# Patient Record
Sex: Female | Born: 1985 | Hispanic: Yes | Marital: Single | Smoking: Never smoker
Health system: Southern US, Community
[De-identification: ages and names within clinical notes are randomized; demographics above are authoritative.]

## PROBLEM LIST (undated history)

## (undated) DIAGNOSIS — I10 Essential (primary) hypertension: Secondary | ICD-10-CM

---

## 2011-05-01 HISTORY — PX: TUBAL LIGATION: SHX77

## 2015-03-13 ENCOUNTER — Encounter (HOSPITAL_COMMUNITY): Payer: Self-pay

## 2015-03-13 ENCOUNTER — Emergency Department (HOSPITAL_COMMUNITY)
Admission: EM | Admit: 2015-03-13 | Discharge: 2015-03-13 | Disposition: A | Discharge status: Home / Self Care | Payer: Medicaid Other | Attending: Physician Assistant | Admitting: Physician Assistant

## 2015-03-13 DIAGNOSIS — R200 Anesthesia of skin: Secondary | ICD-10-CM | POA: Diagnosis present

## 2015-03-13 DIAGNOSIS — M5441 Lumbago with sciatica, right side: Secondary | ICD-10-CM | POA: Diagnosis not present

## 2015-03-13 DIAGNOSIS — R202 Paresthesia of skin: Secondary | ICD-10-CM

## 2015-03-13 DIAGNOSIS — I10 Essential (primary) hypertension: Secondary | ICD-10-CM | POA: Insufficient documentation

## 2015-03-13 HISTORY — DX: Essential (primary) hypertension: I10

## 2015-03-13 MED ORDER — METHOCARBAMOL 500 MG PO TABS: 500.0000 mg | ORAL_TABLET | Freq: Two times a day (BID) | ORAL | Status: DC | PRN

## 2015-03-13 MED ORDER — NAPROXEN 250 MG PO TABS: 250.0000 mg | ORAL_TABLET | Freq: Two times a day (BID) | ORAL | Status: DC

## 2015-03-13 NOTE — ED Notes (Signed)
Pt. Reports having rt. Arm numbness for a few days.  Denies any injuries into her neck or back.  Does have a hx of migraines but denies any headaches at the present time.  Also intermittent sore throat,    Pt. Has a hx of HTN and has stopped her Medication. Pt. Also has intermittent numbness rt. Leg.  Denies any injuries but does have rt. Lower back pain.

## 2015-03-13 NOTE — Discharge Instructions (Signed)
Back Exercises °The following exercises strengthen the muscles that help to support the back. They also help to keep the lower back flexible. Doing these exercises can help to prevent back pain or lessen existing pain. °If you have back pain or discomfort, try doing these exercises 2-3 times each day or as told by your health care provider. When the pain goes away, do them once each day, but increase the number of times that you repeat the steps for each exercise (do more repetitions). If you do not have back pain or discomfort, do these exercises once each day or as told by your health care provider. °EXERCISES °Single Knee to Chest °Repeat these steps 3-5 times for each leg: °· Lie on your back on a firm bed or the floor with your legs extended. °· Bring one knee to your chest. Your other leg should stay extended and in contact with the floor. °· Hold your knee in place by grabbing your knee or thigh. °· Pull on your knee until you feel a gentle stretch in your lower back. °· Hold the stretch for 10-30 seconds. °· Slowly release and straighten your leg. °Pelvic Tilt °Repeat these steps 5-10 times: °· Lie on your back on a firm bed or the floor with your legs extended. °· Bend your knees so they are pointing toward the ceiling and your feet are flat on the floor. °· Tighten your lower abdominal muscles to press your lower back against the floor. This motion will tilt your pelvis so your tailbone points up toward the ceiling instead of pointing to your feet or the floor. °· With gentle tension and even breathing, hold this position for 5-10 seconds. °Cat-Cow °Repeat these steps until your lower back becomes more flexible: °· Get into a hands-and-knees position on a firm surface. Keep your hands under your shoulders, and keep your knees under your hips. You may place padding under your knees for comfort. °· Let your head hang down, and point your tailbone toward the floor so your lower back becomes rounded like the  back of a cat. °· Hold this position for 5 seconds. °· Slowly lift your head and point your tailbone up toward the ceiling so your back forms a sagging arch like the back of a cow. °· Hold this position for 5 seconds. °Press-Ups °Repeat these steps 5-10 times: °· Lie on your abdomen (face-down) on the floor. °· Place your palms near your head, about shoulder-width apart. °· While you keep your back as relaxed as possible and keep your hips on the floor, slowly straighten your arms to raise the top half of your body and lift your shoulders. Do not use your back muscles to raise your upper torso. You may adjust the placement of your hands to make yourself more comfortable. °· Hold this position for 5 seconds while you keep your back relaxed. °· Slowly return to lying flat on the floor. °Bridges °Repeat these steps 10 times: °· Lie on your back on a firm surface. °· Bend your knees so they are pointing toward the ceiling and your feet are flat on the floor. °· Tighten your buttocks muscles and lift your buttocks off of the floor until your waist is at almost the same height as your knees. You should feel the muscles working in your buttocks and the back of your thighs. If you do not feel these muscles, slide your feet 1-2 inches farther away from your buttocks. °· Hold this position for 3-5   seconds. °· Slowly lower your hips to the starting position, and allow your buttocks muscles to relax completely. °If this exercise is too easy, try doing it with your arms crossed over your chest. °Abdominal Crunches °Repeat these steps 5-10 times: °· Lie on your back on a firm bed or the floor with your legs extended. °· Bend your knees so they are pointing toward the ceiling and your feet are flat on the floor. °· Cross your arms over your chest. °· Tip your chin slightly toward your chest without bending your neck. °· Tighten your abdominal muscles and slowly raise your trunk (torso) high enough to lift your shoulder blades a  tiny bit off of the floor. Avoid raising your torso higher than that, because it can put too much stress on your low back and it does not help to strengthen your abdominal muscles. °· Slowly return to your starting position. °Back Lifts °Repeat these steps 5-10 times: °· Lie on your abdomen (face-down) with your arms at your sides, and rest your forehead on the floor. °· Tighten the muscles in your legs and your buttocks. °· Slowly lift your chest off of the floor while you keep your hips pressed to the floor. Keep the back of your head in line with the curve in your back. Your eyes should be looking at the floor. °· Hold this position for 3-5 seconds. °· Slowly return to your starting position. °SEEK MEDICAL CARE IF: °· Your back pain or discomfort gets much worse when you do an exercise. °· Your back pain or discomfort does not lessen within 2 hours after you exercise. °If you have any of these problems, stop doing these exercises right away. Do not do them again unless your health care provider says that you can. °SEEK IMMEDIATE MEDICAL CARE IF: °· You develop sudden, severe back pain. If this happens, stop doing the exercises right away. Do not do them again unless your health care provider says that you can. °  °This information is not intended to replace advice given to you by your health care provider. Make sure you discuss any questions you have with your health care provider. °  °Document Released: 05/24/2004 Document Revised: 01/05/2015 Document Reviewed: 06/10/2014 °Elsevier Interactive Patient Education ©2016 [employer] °Sciatica °Sciatica is pain, weakness, numbness, or tingling along the path of the sciatic nerve. The nerve starts in the lower back and runs down the back of each leg. The nerve controls the muscles in the lower leg and in the back of the knee, while also providing sensation to the back of the thigh, lower leg, and the sole of your foot. Sciatica is a symptom of another medical  condition. For instance, nerve damage or certain conditions, such as a herniated disk or bone spur on the spine, pinch or put pressure on the sciatic nerve. This causes the pain, weakness, or other sensations normally associated with sciatica. Generally, sciatica only affects one side of the body. °CAUSES  °· Herniated or slipped disc. °· Degenerative disk disease. °· A pain disorder involving the narrow muscle in the buttocks (piriformis syndrome). °· Pelvic injury or fracture. °· Pregnancy. °· Tumor (rare). °SYMPTOMS  °Symptoms can vary from mild to very severe. The symptoms usually travel from the low back to the buttocks and down the back of the leg. Symptoms can include: °· Mild tingling or dull aches in the lower back, leg, or hip. °· Numbness in the back of the calf or sole of the   foot.  Burning sensations in the lower back, leg, or hip.  Sharp pains in the lower back, leg, or hip.  Leg weakness.  Severe back pain inhibiting movement. These symptoms may get worse with coughing, sneezing, laughing, or prolonged sitting or standing. Also, being overweight may worsen symptoms. DIAGNOSIS  Your caregiver will perform a physical exam to look for common symptoms of sciatica. He or she may ask you to do certain movements or activities that would trigger sciatic nerve pain. Other tests may be performed to find the cause of the sciatica. These may include:  Blood tests.  X-rays.  Imaging tests, such as an MRI or CT scan. TREATMENT  Treatment is directed at the cause of the sciatic pain. Sometimes, treatment is not necessary and the pain and discomfort goes away on its own. If treatment is needed, your caregiver may suggest:  Over-the-counter medicines to relieve pain.  Prescription medicines, such as anti-inflammatory medicine, muscle relaxants, or narcotics.  Applying heat or ice to the painful area.  Steroid injections to lessen pain, irritation, and inflammation around the  nerve.  Reducing activity during periods of pain.  Exercising and stretching to strengthen your abdomen and improve flexibility of your spine. Your caregiver may suggest losing weight if the extra weight makes the back pain worse.  Physical therapy.  Surgery to eliminate what is pressing or pinching the nerve, such as a bone spur or part of a herniated disk. HOME CARE INSTRUCTIONS   Only take over-the-counter or prescription medicines for pain or discomfort as directed by your caregiver.  Apply ice to the affected area for 20 minutes, 3-4 times a day for the first 48-72 hours. Then try heat in the same way.  Exercise, stretch, or perform your usual activities if these do not aggravate your pain.  Attend physical therapy sessions as directed by your caregiver.  Keep all follow-up appointments as directed by your caregiver.  Do not wear high heels or shoes that do not provide proper support.  Check your mattress to see if it is too soft. A firm mattress may lessen your pain and discomfort. SEEK IMMEDIATE MEDICAL CARE IF:   You lose control of your bowel or bladder (incontinence).  You have increasing weakness in the lower back, pelvis, buttocks, or legs.  You have redness or swelling of your back.  You have a burning sensation when you urinate.  You have pain that gets worse when you lie down or awakens you at night.  Your pain is worse than you have experienced in the past.  Your pain is lasting longer than 4 weeks.  You are suddenly losing weight without reason. MAKE SURE YOU:  Understand these instructions.  Will watch your condition.  Will get help right away if you are not doing well or get worse.   This information is not intended to replace advice given to you by your health care provider. Make sure you discuss any questions you have with your health care provider.   Document Released: 04/10/2001 Document Revised: 01/05/2015 Document Reviewed:  08/26/2011 Elsevier Interactive Patient Education 2016 [employer] Shoulder Pain The shoulder is the joint that connects your arms to your body. The bones that form the shoulder joint include the upper arm bone (humerus), the shoulder blade (scapula), and the collarbone (clavicle). The top of the humerus is shaped like a ball and fits into a rather flat socket on the scapula (glenoid cavity). A combination of muscles and strong, fibrous tissues that connect  muscles to bones (tendons) support your shoulder joint and hold the ball in the socket. Small, fluid-filled sacs (bursae) are located in different areas of the joint. They act as cushions between the bones and the overlying soft tissues and help reduce friction between the gliding tendons and the bone as you move your arm. Your shoulder joint allows a wide range of motion in your arm. This range of motion allows you to do things like scratch your back or throw a ball. However, this range of motion also makes your shoulder more prone to pain from overuse and injury. Causes of shoulder pain can originate from both injury and overuse and usually can be grouped in the following four categories:  Redness, swelling, and pain (inflammation) of the tendon (tendinitis) or the bursae (bursitis).  Instability, such as a dislocation of the joint.  Inflammation of the joint (arthritis).  Broken bone (fracture). HOME CARE INSTRUCTIONS   Apply ice to the sore area.  Put ice in a plastic bag.  Place a towel between your skin and the bag.  Leave the ice on for 15-20 minutes, 3-4 times per day for the first 2 days, or as directed by your health care provider.  Stop using cold packs if they do not help with the pain.  If you have a shoulder sling or immobilizer, wear it as long as your caregiver instructs. Only remove it to shower or bathe. Move your arm as little as possible, but keep your hand moving to prevent swelling.  Squeeze a soft ball or foam  pad as much as possible to help prevent swelling.  Only take over-the-counter or prescription medicines for pain, discomfort, or fever as directed by your caregiver. SEEK MEDICAL CARE IF:   Your shoulder pain increases, or new pain develops in your arm, hand, or fingers.  Your hand or fingers become cold and numb.  Your pain is not relieved with medicines. SEEK IMMEDIATE MEDICAL CARE IF:   Your arm, hand, or fingers are numb or tingling.  Your arm, hand, or fingers are significantly swollen or turn white or blue. MAKE SURE YOU:   Understand these instructions.  Will watch your condition.  Will get help right away if you are not doing well or get worse.   This information is not intended to replace advice given to you by your health care provider. Make sure you discuss any questions you have with your health care provider.   Document Released: 01/24/2005 Document Revised: 05/07/2014 Document Reviewed: 08/09/2014 Elsevier Interactive Patient Education [employer]

## 2015-03-13 NOTE — ED Provider Notes (Signed)
CSN: [id number]     Arrival date & time 03/13/15  4098 History   First MD Initiated Contact with Patient 03/13/15 1024     Chief Complaint  Patient presents with  . Numbness   Bailey Kelly is a 29 y.o. female with a history of hypertension and obesity who presents to the emergency department complaining of intermittent numbness and tingling to her right arm that sometimes moves to her left arm. She reports yesterday she started feeling that her right arm is numb and tingly and has persisted since yesterday. She reports she can still feel her her arm when touched but that it just "doesn't feel right." She reports sometimes she has this feeling in her left arm intermittently. She denies this feeling currently. She also complains of right low back pain that radiates down her posterior right leg. She reports all of her symptoms have been ongoing for 2-3 months. She denies injury or trauma to her head, neck or back. She denies neck pain. She reports she takes Aleve intermittently with some relief. The patient denies fevers, chills, neck pain, neck stiffness, headache, changes to her vision, trauma, double vision, abdominal pain, nausea, vomiting, diarrhea, loss of bladder control, loss of bowel control, difficulty urinating, urinary symptoms, lightheadedness, dizziness, weakness, history of cancer, history of IV drug use, or new rashes.  (Consider location/radiation/quality/duration/timing/severity/associated sxs/prior Treatment) HPI  Past Medical History  Diagnosis Date  . Hypertension    History reviewed. No pertinent past surgical history. No family history on file. Social History  Substance Use Topics  . Smoking status: Never Smoker   . Smokeless tobacco: None  . Alcohol Use: Yes     Comment: occassional   OB History    No data available     Review of Systems  Constitutional: Negative for fever and chills.  HENT: Negative for congestion and sore throat.   Eyes: Negative for pain and  visual disturbance.  Respiratory: Negative for cough and shortness of breath.   Cardiovascular: Negative for chest pain and palpitations.  Gastrointestinal: Negative for nausea, vomiting, abdominal pain and diarrhea.  Genitourinary: Negative for dysuria, urgency, frequency, hematuria, flank pain, decreased urine volume and difficulty urinating.  Musculoskeletal: Positive for back pain. Negative for neck pain.  Skin: Negative for rash.  Neurological: Positive for numbness. Negative for dizziness, syncope, speech difficulty, weakness, light-headedness and headaches.      Allergies  Review of patient's allergies indicates no known allergies.  Home Medications   Prior to Admission medications   Medication Sig Start Date End Date Taking? Authorizing Provider  methocarbamol (ROBAXIN) 500 MG tablet Take 1 tablet (500 mg total) by mouth 2 (two) times daily as needed for muscle spasms. 03/13/15   Everlene Farrier, PA-C  naproxen (NAPROSYN) 250 MG tablet Take 1 tablet (250 mg total) by mouth 2 (two) times daily with a meal. 03/13/15   Everlene Farrier, PA-C   BP 121/54 mmHg  Pulse 60  Temp(Src) 98.5 F (36.9 C) (Oral)  Resp 21  Ht  (1.6 m)  Wt 300 lb (136.079 kg)  BMI 53.16 kg/m2  SpO2 100%  LMP 02/10/2015 Physical Exam  Constitutional: She is oriented to person, place, and time. She appears well-developed and well-nourished. No distress.  Nontoxic appearing obese female.  HENT:  Head: Normocephalic and atraumatic.  Right Ear: External ear normal.  Left Ear: External ear normal.  Mouth/Throat: Oropharynx is clear and moist. No oropharyngeal exudate.  Bilateral tympanic membranes are pearly-gray without erythema or loss of  landmarks.   Eyes: Conjunctivae and EOM are normal. Pupils are equal, round, and reactive to light. Right eye exhibits no discharge. Left eye exhibits no discharge.  EOMs intact. No nystagmus. Vision is grossly intact.  Neck: Normal range of motion. Neck supple. No  JVD present. No tracheal deviation present.  No midline neck tenderness. Full range of motion of her neck without difficulty.  Cardiovascular: Normal rate, regular rhythm, normal heart sounds and intact distal pulses.  Exam reveals no gallop and no friction rub.   No murmur heard. Bilateral radial and posterior tibialis pulses are intact.  Pulmonary/Chest: Effort normal and breath sounds normal. No respiratory distress. She has no wheezes. She has no rales.  Abdominal: Soft. She exhibits no distension. There is no tenderness. There is no guarding.  Musculoskeletal: Normal range of motion. She exhibits no edema.  Patient has 5 out of 5 strength in her bilateral upper and lower extremities. She is able to ambulate with normal gait. She does have mild tenderness to her right low back. No midline neck or back tenderness. No back edema, deformity, ecchymosis, erythema or warmth. No bony point tenderness to her right arm. No deformity to her right arm. Good range of motion of her right arm at her shoulder and elbow.  Lymphadenopathy:    She has no cervical adenopathy.  Neurological: She is alert and oriented to person, place, and time. No cranial nerve deficit. Coordination normal.  The patient is alert and oriented 3. Cranial nerves are intact. No pronator drift. Finger to nose intact bilaterally. Vision is grossly intact. EOMs are intact. Sensation is intact to her bilateral upper and lower extremities. When asked she tells me she feels me touching her exactly the same on her right and left arm. Good and equal grip strength bilaterally. Speech is clear and coherent. She is able to ambulate with normal gait.  Skin: Skin is warm and dry. No rash noted. She is not diaphoretic. No erythema. No pallor.  Psychiatric: She has a normal mood and affect. Her behavior is normal.  Nursing note and vitals reviewed.   ED Course  Procedures (including critical care time) Labs Review Labs Reviewed - No data to  display  Imaging Review No results found.    EKG Interpretation None      Filed Vitals:   03/13/15 1030 03/13/15 1045 03/13/15 1100 03/13/15 1115  BP: 103/51 111/70 119/57 121/54  Pulse: 62 58 62 60  Temp:      TempSrc:      Resp:      Height:      Weight:      SpO2: 99% 99% 99% 100%     MDM   Meds given in ED:  Medications - No data to display  Discharge Medication List as of 03/13/2015 11:26 AM    START taking these medications   Details  methocarbamol (ROBAXIN) 500 MG tablet Take 1 tablet (500 mg total) by mouth 2 (two) times daily as needed for muscle spasms., Starting 03/13/2015, Until Discontinued, Print    naproxen (NAPROSYN) 250 MG tablet Take 1 tablet (250 mg total) by mouth 2 (two) times daily with a meal., Starting 03/13/2015, Until Discontinued, Print        Final diagnoses:  Right-sided low back pain with right-sided sciatica  Numbness and tingling of right arm   This  is a 29 y.o. female with a history of hypertension and obesity who presents to the emergency department complaining of intermittent numbness and  tingling to her right arm that sometimes moves to her left arm. She reports yesterday she started feeling that her right arm is numb and tingly and has persisted since yesterday. She reports she can still feel her her arm when touched but that it just "doesn't feel right." She reports sometimes she has this feeling in her left arm intermittently. She denies this feeling currently. She also complains of right low back pain that radiates down her posterior right leg. She reports all of her symptoms have been ongoing for 2-3 months. She denies injury or trauma to her head, neck or back. She denies neck pain. On exam the patient is afebrile and nontoxic appearing. She has no focal neurological deficits. She tells me she can feels me touching her exactly the same to her bilateral arms. She is able to handle a normal gait. She has no back pain red flags. Will  discharge with close follow-up by primary care. Patient given prescription for naproxen and Robaxin for her sciatic and low back pain. I encouraged back exercises. I advised strict return precautions. I advised the patient to follow-up with their primary care provider this week. I advised the patient to return to the emergency department with new or worsening symptoms or new concerns. The patient verbalized understanding and agreement with plan.   This patient was discussed with Dr. Corlis Leak who agrees with assessment and plan.    Everlene Farrier, PA-C 03/13/15 1147  Courteney Randall An, MD 03/13/15 1533

## 2015-06-07 ENCOUNTER — Encounter: Payer: Self-pay | Admitting: Internal Medicine

## 2015-06-07 ENCOUNTER — Other Ambulatory Visit: Payer: Self-pay | Admitting: Family Medicine

## 2015-06-07 ENCOUNTER — Ambulatory Visit (INDEPENDENT_AMBULATORY_CARE_PROVIDER_SITE_OTHER): Payer: Medicaid Other | Admitting: Internal Medicine

## 2015-06-07 VITALS — BP 147/94 | HR 79 | Temp 98.4°F | Ht 63.0 in | Wt 294.0 lb

## 2015-06-07 DIAGNOSIS — G43109 Migraine with aura, not intractable, without status migrainosus: Secondary | ICD-10-CM

## 2015-06-07 DIAGNOSIS — G5601 Carpal tunnel syndrome, right upper limb: Secondary | ICD-10-CM | POA: Diagnosis not present

## 2015-06-07 DIAGNOSIS — R030 Elevated blood-pressure reading, without diagnosis of hypertension: Secondary | ICD-10-CM | POA: Diagnosis not present

## 2015-06-07 DIAGNOSIS — R739 Hyperglycemia, unspecified: Secondary | ICD-10-CM | POA: Diagnosis not present

## 2015-06-07 DIAGNOSIS — IMO0001 Reserved for inherently not codable concepts without codable children: Secondary | ICD-10-CM

## 2015-06-07 DIAGNOSIS — I10 Essential (primary) hypertension: Secondary | ICD-10-CM | POA: Insufficient documentation

## 2015-06-07 DIAGNOSIS — G43909 Migraine, unspecified, not intractable, without status migrainosus: Secondary | ICD-10-CM | POA: Insufficient documentation

## 2015-06-07 DIAGNOSIS — G5600 Carpal tunnel syndrome, unspecified upper limb: Secondary | ICD-10-CM | POA: Insufficient documentation

## 2015-06-07 LAB — BASIC METABOLIC PANEL WITH GFR
BUN: 6 mg/dL — AB (ref 7–25)
CHLORIDE: 104 mmol/L (ref 98–110)
CO2: 25 mmol/L (ref 20–31)
Calcium: 9.3 mg/dL (ref 8.6–10.2)
Creat: 0.44 mg/dL — ABNORMAL LOW (ref 0.50–1.10)
GFR, Est African American: >89 mL/min (ref >=60)
GFR, Est Non African American: >89 mL/min (ref >=60)
GLUCOSE: 120 mg/dL — AB (ref 65–99)
POTASSIUM: 4.5 mmol/L (ref 3.5–5.3)
Sodium: 139 mmol/L (ref 135–146)

## 2015-06-07 LAB — TSH: TSH: 1.38 mIU/L

## 2015-06-07 NOTE — Assessment & Plan Note (Signed)
History and +Phalen's consistent with carpal tunnel on the right.  -cock up splint Rx given, trial of 6 weeks for improvement in symptoms  -TSH checked today given carpal tunnel as well as reported fatigue and weight gain

## 2015-06-07 NOTE — Assessment & Plan Note (Addendum)
BP mildly elevated today and patient reports history of HTN.  -obtain BMET today to evaluate for end organ damage  -will check BP at next visit and start HTN medication regimen if needed  -check A1C if glucose elevated given history of HTN as well as obesity concerning for possibility of T2DM

## 2015-06-07 NOTE — Progress Notes (Signed)
Subjective:    Bailey Kelly - 30 y.o. female MRN [id number]  Date of birth: 14-Dec-1985  HPI  Bailey Kelly is 31 y.o. female with PMH of reported HTN. migraines and obesity who presents to establish care with the clinic.   Headaches: Reports headaches 1x/week on average. Describes pain as over one side of her forehead. Pain usually lasts 3-4 hours at a time. Reports associated nausea but no vomiting. Blurry vision peripherally and hearing changes. Mild photophobia. Is still able to get through day with headache. Takes ibuprofen for the headaches with good relief.   HTN: Reports history of elevated BP since childhood requiring medications. Also had issues with HTN during pregnancy. Does not remember the name of the medication she was on and has been without medication for about 4 years.   Forearm and Hand Numbness: Reports hands and forearms getting numb with tingling sensation bilaterally R much worse than L. Lying down makes it worse and has to shake her hands out in the morning. Does housecleaning with repetitive movements. Occasionally has some swelling of the R wrist/hand compared to L.   ROS: +Fatigue, +Weight gain        -  reports that she has never smoked. She does not have any smokeless tobacco history on file. - Review of Systems: Per HPI. - Past Medical History: Patient Active Problem List   Diagnosis Date Noted  . Migraine 06/07/2015  . Elevated blood pressure 06/07/2015  . Carpal tunnel syndrome 06/07/2015  . Severe obesity (BMI >= 40) (HCC) 06/07/2015   - Medications: reviewed and updated Current Outpatient Prescriptions  Medication Sig Dispense Refill  . methocarbamol (ROBAXIN) 500 MG tablet Take 1 tablet (500 mg total) by mouth 2 (two) times daily as needed for muscle spasms. 20 tablet 0  . naproxen (NAPROSYN) 250 MG tablet Take 1 tablet (250 mg total) by mouth 2 (two) times daily with a meal. 30 tablet 0   No current facility-administered medications for this  visit.      Objective:   Physical Exam BP 147/94 mmHg  Pulse 79  Temp(Src) 98.4 F (36.9 C) (Oral)  Ht  (1.6 m)  Wt 294 lb (133.358 kg)  BMI 52.09 kg/m2  LMP 05/07/2015 (Approximate) Gen: NAD, alert, cooperative with exam, well-appearing HEENT: NCAT, PERRL, clear conjunctiva, oropharynx clear, supple neck CV: RRR, good S1/S2, no murmur Resp: CTABL, no wheezes, non-labored Abd: SNTND, BS present, no guarding or organomegaly Ext: 2+ radial pulses bilaterally, no edema or erythema to the wrists/hands bilaterally, no joint tenderness to palpation, negative Tinels, +Phalens on the R  Neuro: no gross deficits.  Psych: good insight, alert and oriented     Assessment & Plan:   Migraine Headache seems consistent with migraine. Well controlled with Ibuprofen. Patient is not at risk for medication overuse headache as she is only using Ibuprofen 4-5x per month.  -continue with ibuprofen for headaches as needed -if headaches became more debilitating or increased in frequency causing patient to use more OTC pain meds, would consider prophylaxis with medication such as propanolol   Elevated blood pressure BP mildly elevated today and patient reports history of HTN.  -obtain BMET today to evaluate for end organ damage  -will check BP at next visit and start HTN medication regimen if needed  -check A1C if glucose elevated given history of HTN as well as obesity concerning for possibility of T2DM   Severe obesity (BMI >= 40) (HCC) Patient reports increase in weight gain over the  past few years. Unfortunately history of previous weights not available in EMR. Patient does report that she knows she needs to lose this weight and has started to go to the gym.  -continue to encourage healthy lifestyle choices  -discuss nutrition consult at next visit   Carpal tunnel syndrome History and +Phalen's consistent with carpal tunnel on the right.  -cock up splint Rx given, trial of 6 weeks for  improvement in symptoms  -TSH checked today given carpal tunnel as well as reported fatigue and weight gain     Instructed patient to return in the next 1-3 weeks for PAP smear and further management of elevated blood pressure.   Marcy Siren, D.O. 06/07/2015, 10:00 AM PGY-1, [facility]

## 2015-06-07 NOTE — Patient Instructions (Signed)
Thank you for coming to see me today. It was a pleasure and nice to meet you. Today we talked about:   Arm Numbness: This sounds consistent with Carpal Tunnel. Please take your prescription for the splint to a medical supply company to have it filled.   We have gotten labs to check your basic electrolytes, kidney function, blood sugar, and thyroid level. I will call you with results.   Please follow-up with Dr. Earlene Plater in 1-3 weeks for a pap smear.   If you have any questions or concerns, please do not hesitate to call the office at [phone].  Take Care,   Marcy Siren, DO

## 2015-06-07 NOTE — Assessment & Plan Note (Signed)
Patient reports increase in weight gain over the past few years. Unfortunately history of previous weights not available in EMR. Patient does report that she knows she needs to lose this weight and has started to go to the gym.  -continue to encourage healthy lifestyle choices  -discuss nutrition consult at next visit

## 2015-06-07 NOTE — Assessment & Plan Note (Signed)
Headache seems consistent with migraine. Well controlled with Ibuprofen. Patient is not at risk for medication overuse headache as she is only using Ibuprofen 4-5x per month.  -continue with ibuprofen for headaches as needed -if headaches became more debilitating or increased in frequency causing patient to use more OTC pain meds, would consider prophylaxis with medication such as propanolol

## 2015-06-08 ENCOUNTER — Ambulatory Visit (INDEPENDENT_AMBULATORY_CARE_PROVIDER_SITE_OTHER): Payer: Medicaid Other | Admitting: Internal Medicine

## 2015-06-08 ENCOUNTER — Other Ambulatory Visit (HOSPITAL_COMMUNITY)
Admission: RE | Admit: 2015-06-08 | Discharge: 2015-06-08 | Disposition: A | Discharge status: Home / Self Care | Payer: Medicaid Other | Source: Ambulatory Visit | Attending: Family Medicine | Admitting: Family Medicine

## 2015-06-08 ENCOUNTER — Telehealth: Payer: Self-pay | Admitting: Internal Medicine

## 2015-06-08 ENCOUNTER — Encounter: Payer: Self-pay | Admitting: Internal Medicine

## 2015-06-08 VITALS — BP 135/74 | HR 83 | Temp 98.5°F | Wt 293.3 lb

## 2015-06-08 DIAGNOSIS — Z124 Encounter for screening for malignant neoplasm of cervix: Secondary | ICD-10-CM

## 2015-06-08 DIAGNOSIS — Z113 Encounter for screening for infections with a predominantly sexual mode of transmission: Secondary | ICD-10-CM

## 2015-06-08 DIAGNOSIS — Z01419 Encounter for gynecological examination (general) (routine) without abnormal findings: Secondary | ICD-10-CM | POA: Insufficient documentation

## 2015-06-08 DIAGNOSIS — I10 Essential (primary) hypertension: Secondary | ICD-10-CM | POA: Diagnosis not present

## 2015-06-08 DIAGNOSIS — Z0000 Encounter for general adult medical examination without abnormal findings: Secondary | ICD-10-CM

## 2015-06-08 LAB — POCT GLYCOSYLATED HEMOGLOBIN (HGB A1C): Hemoglobin A1C: 5.9

## 2015-06-08 LAB — RPR

## 2015-06-08 LAB — HIV ANTIBODY (ROUTINE TESTING W REFLEX): HIV: NONREACTIVE

## 2015-06-08 MED ORDER — HYDROCHLOROTHIAZIDE 12.5 MG PO TABS: 12.5000 mg | ORAL_TABLET | Freq: Every day | ORAL | Status: DC

## 2015-06-08 NOTE — Assessment & Plan Note (Signed)
Patient asymptomatic and not engaging in risky sexual behavior. However, she desired STD screening today.  -GC/Chlamydia obtained with PAP smear  -HIV and RPR ordered

## 2015-06-08 NOTE — Assessment & Plan Note (Signed)
Patient reports last PAP in 2011 with no h/o abnormal PAP smears.  -PAP performed today, will f/u with results to determine appropriate time of next PAP  -encouraged patient to perform self breast exams monthly

## 2015-06-08 NOTE — Addendum Note (Signed)
Addended by: Jennette Bill on: 06/08/2015 10:12 AM   Modules accepted: Orders

## 2015-06-08 NOTE — Patient Instructions (Signed)
Thank you for coming to see me today. It was a pleasure. Today we talked about:   Blood Pressure: Take the medication daily. We will be monitoring your blood pressure to see if we need to increase this dose over time.   Blood Sugar: I will let you know what your A1C (blood sugar over time) is and what types of things we might need to do based on the result.   Annual Female Exam: I will let you know the results of your PAP smear and other testing.   Please follow-up with Dr. Earlene Plater as needed.   If you have any questions or concerns, please do not hesitate to call the office at [phone].  Take Care,   Marcy Siren, DO

## 2015-06-08 NOTE — Assessment & Plan Note (Signed)
BP not as elevated today as at prior visit. Kidney function good on BMET yesterday. Patient desires to start BP medication as she has taken it in the past and she is worried some of her headaches might be related to elevated BPs.  -HCTZ 12.5 mg started today  -will titrate dose over time prn

## 2015-06-08 NOTE — Addendum Note (Signed)
Addended by: Jone Baseman D on: 06/08/2015 11:37 AM   Modules accepted: Kipp Brood

## 2015-06-08 NOTE — Progress Notes (Signed)
   Subjective:   Bailey Kelly is a 30 y.o. female with a history of HTN, obesity, and migraines here for an annual gynecological exam.   Gyn concerns/Preventative healthcare  Last menstrual period: Patient's last menstrual period was 05/07/2015 (approximate).  Regular periods: yes   Heavy bleeding: uses tampon and pad, used to be heavier but cycle shortened after BTL   Sexually active: yes, with one female partner   Contraception or hormonal therapy: BTL   Hx of STD: None. Patient does desire STD screening.  Vaginal discharge: No   Dysuria:No   Breast mass or concerns: No  Last pap smear: 2011 only pap smear. Was normal.    PMH, Surgical Hx, Family Hx, Social History reviewed and updated as below.  Past Medical History  Diagnosis Date  . Hypertension    Past Surgical History  Procedure Laterality Date  . Cesarean section  2011, 2013  . Tubal ligation  2013   No family history on file. Social History   Social History  . Marital Status: Single    Spouse Name: N/A  . Number of Children: N/A  . Years of Education: N/A   Social History Main Topics  . Smoking status: Never Smoker   . Smokeless tobacco: None  . Alcohol Use: Yes     Comment: occassional  . Drug Use: No  . Sexual Activity: Yes    Birth Control/ Protection: None, Surgical   Other Topics Concern  . None   Social History Narrative    Review of Systems:  Per HPI. Otherwise a complete 10 point ROS was negative.   Objective:   Filed Vitals:   06/08/15 0848  BP: 135/74  Pulse: 83  Temp: 98.5 F (36.9 C)   Exam: General: well appearing, NAD. HEENT: NCAT. Breasts: Equal in size. Tissue is dense but without masses. Nipples normal with no discharge.  Cardiovascular: RRR. No murmurs, rubs, or gallops. Respiratory: CTAB. No rales, rhonchi, or wheeze. Abdomen: soft, nontender, nondistended. Extremities: warm, well perfused. No LE edema. Skin: Warm, dry, intact. Neuro: No focal  deficits.  Pelvic Exam:        External: normal female genitalia without lesions or masses        Vagina: normal without lesions or masses        Cervix: normal without lesions or masses        Pap smear: performed        Samples for GC/Chlamydia obtained  Lab Results  Component Value Date   TSH 1.38 06/07/2015   Assessment/Plan:  Screening for STD (sexually transmitted disease) Patient asymptomatic and not engaging in risky sexual behavior. However, she desired STD screening today.  -GC/Chlamydia obtained with PAP smear  -HIV and RPR ordered   Hypertension BP not as elevated today as at prior visit. Kidney function good on BMET yesterday. Patient desires to start BP medication as she has taken it in the past and she is worried some of her headaches might be related to elevated BPs.  -HCTZ 12.5 mg started today  -will titrate dose over time prn   Healthcare maintenance Patient reports last PAP in 2011 with no h/o abnormal PAP smears.  -PAP performed today, will f/u with results to determine appropriate time of next PAP  -encouraged patient to perform self breast exams monthly

## 2015-06-10 LAB — CYTOLOGY - PAP

## 2015-06-13 NOTE — Telephone Encounter (Signed)
Left voicemail to have patient call me to discuss recent lab results.   Marcy Siren, D.O. 06/13/2015, 10:10 AM PGY-1, [facility]

## 2015-06-13 NOTE — Telephone Encounter (Signed)
-----   Message from Moses Manners, MD sent at 06/08/2015  1:46 PM EST -----   ----- Message -----    From: Lab in [facility] Interface    Sent: 06/08/2015  10:45 AM      To: Moses Manners, MD

## 2015-06-15 ENCOUNTER — Encounter: Payer: Self-pay | Admitting: Student

## 2015-06-15 ENCOUNTER — Telehealth: Payer: Self-pay | Admitting: Internal Medicine

## 2015-06-15 NOTE — Telephone Encounter (Signed)
Pt is calling to see what her test results were. jw

## 2015-06-16 ENCOUNTER — Telehealth: Payer: Self-pay | Admitting: Student

## 2015-06-16 NOTE — Telephone Encounter (Signed)
Left message on VM. All of her tests from recent visit are normal.

## 2016-07-31 DIAGNOSIS — B999 Unspecified infectious disease: Secondary | ICD-10-CM | POA: Diagnosis not present

## 2016-07-31 DIAGNOSIS — R05 Cough: Secondary | ICD-10-CM | POA: Diagnosis not present

## 2016-07-31 DIAGNOSIS — K219 Gastro-esophageal reflux disease without esophagitis: Secondary | ICD-10-CM | POA: Diagnosis not present

## 2016-07-31 DIAGNOSIS — J4530 Mild persistent asthma, uncomplicated: Secondary | ICD-10-CM | POA: Diagnosis not present

## 2016-07-31 DIAGNOSIS — J309 Allergic rhinitis, unspecified: Secondary | ICD-10-CM | POA: Diagnosis not present

## 2016-07-31 DIAGNOSIS — H1010 Acute atopic conjunctivitis, unspecified eye: Secondary | ICD-10-CM | POA: Diagnosis not present

## 2018-05-19 ENCOUNTER — Ambulatory Visit (INDEPENDENT_AMBULATORY_CARE_PROVIDER_SITE_OTHER): Payer: Medicaid Other | Admitting: Family Medicine

## 2018-05-19 ENCOUNTER — Other Ambulatory Visit: Payer: Self-pay

## 2018-05-19 ENCOUNTER — Ambulatory Visit (HOSPITAL_COMMUNITY)
Admission: RE | Admit: 2018-05-19 | Discharge: 2018-05-19 | Disposition: A | Discharge status: Home / Self Care | Payer: Medicaid Other | Source: Ambulatory Visit | Attending: Family Medicine | Admitting: Family Medicine

## 2018-05-19 ENCOUNTER — Encounter: Payer: Self-pay | Admitting: Family Medicine

## 2018-05-19 VITALS — BP 152/88 | HR 94 | Temp 99.4°F | Wt 298.4 lb

## 2018-05-19 DIAGNOSIS — R631 Polydipsia: Secondary | ICD-10-CM | POA: Diagnosis not present

## 2018-05-19 DIAGNOSIS — R42 Dizziness and giddiness: Secondary | ICD-10-CM

## 2018-05-19 DIAGNOSIS — R079 Chest pain, unspecified: Secondary | ICD-10-CM

## 2018-05-19 DIAGNOSIS — F411 Generalized anxiety disorder: Secondary | ICD-10-CM | POA: Diagnosis not present

## 2018-05-19 DIAGNOSIS — I10 Essential (primary) hypertension: Secondary | ICD-10-CM | POA: Diagnosis not present

## 2018-05-19 LAB — POCT GLYCOSYLATED HEMOGLOBIN (HGB A1C): HEMOGLOBIN A1C: 6.1 % — AB (ref 4.0–5.6)

## 2018-05-19 MED ORDER — FLUOXETINE HCL 10 MG PO CAPS: 10.0000 mg | ORAL_CAPSULE | Freq: Every day | ORAL | 3 refills | Status: AC

## 2018-05-19 MED ORDER — HYDROCHLOROTHIAZIDE 12.5 MG PO TABS: 12.5000 mg | ORAL_TABLET | Freq: Every day | ORAL | 3 refills | Status: DC

## 2018-05-19 NOTE — Progress Notes (Signed)
Patient Name: Bailey Kelly Date of Birth: January 01, 1986 Date of Visit: 05/19/18 PCP: Sandre Kitty, MD  Chief Complaint:  Chest pain, numbness around lips and hands, palpitations   Subjective: Bailey Kelly is a pleasant 33 y.o. year old with history significant for hypertension and obesity presenting today with 2 weeks of chest pain and anxiety.  The patient reports 2 weeks of chest pain.  The chest pain occurs several times a day. The pain goes from the front of her chest to the back it lasts several seconds.  It is associated with palpitations, perioral numbness ,numbness in her hands and what she describes as rapid short shallow breathing.  This chest pain typically occurs at work when she is feeling stressed or when she is talking with her ex-husband.  She is concerned this is related to her use of an over-the-counter diuretic.  She denies exertional chest pain.  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  The pain worsens when she presses along her chest wall.  The pain is at times worse at night when she is laying thinking about things she reports.  It is not positional.  She denies lower extremity swelling, syncope, recent travel or estrogen therapy use.  There is no family history of thromboembolic disease.  There is no family history of early cardiac events.  She reports her family is a strong history of anxiety.  She is most concerned that she is pulled a muscle.  She smokes 1 to 2 cigarettes a few times a week.  The patient reports several years of dizziness upon standing.  She reports that when she feels overwhelmed by her kids she feels lightheaded and is unable to focus.  She denies chest pain with these events and the events are separate.  She reports her periods since her tubal ligation 7 years ago are moderate.  Her periods occur every month they last for 4 days.  She has a day of heavy bleeding whereby she has to change her pad and tampon every 2 hours.  The patient  reports significant stress in her life.  She reports feelings of anxiety and constant worry.  She reports even when she does not have anything to do at all and should be worried she is worrying.  She reports sadness.  She moved to [place] with her significant other 4 years ago.  Her family is in [place].  All of her friends are in [place].  She and her husband have now separated.  She has 2 children.  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  She is able to talk with her friends and family in [place] which provide some support.   ROS:  ROS As above.   I have reviewed the patient's medical, surgical, family, and social history as appropriate.   Vitals:   05/19/18 1100  BP: (!) 152/88  Pulse: 94  Temp: 99.4 F (37.4 C)  SpO2: 98%   Filed Weights   05/19/18 1100  Weight: 298 lb 6.4 oz (135.4 kg)    HEENT: Sclera anicteric. Dentition is moderate. Appears well hydrated. Neck: Supple  Cardiac: Regular rate and rhythm. Normal S1/S2. No murmurs, rubs, or gallops appreciated.TTP along pectoralis muscles.  Lungs: Clear bilaterally to ascultation.  Abdomen: Normoactive bowel sounds. No tenderness to deep or light palpation. No rebound or guarding.  Extremities: Warm, well perfused without edema.  Skin: Warm,  dry Psych: Pleasant and appropriate   EKG- Sinus rhythm without ST/T wave changes.   PERC- score of 0 CAD Risk Consortium 1% risk (if age 23)    Tylin was seen today for chest pain.  Diagnoses and all orders for this visit:  Chest pain, likely multifactorial.  I suspect her episodes of dizziness may be related to anxiety.  Her episodes of chest along with palpitations and perioral numbness along with hand numbness are most consistent with a panic attack.  Her CAD risk consortium score is less than 1% for having coronary artery disease.  She has no exertional chest pain and the chest pain is nonspecific in nature lasting only a  few seconds.  Additionally her PERC score is 0.  Her symptoms are not consistent with a pericarditis.  She has no signs or symptoms of heart failure.  Reviewed strict ED return precautions including exertional chest pain,chest pain that is longer in duration.  -     EKG 12-Lead  Anxiety patient tearful throughout discussion.  We discussed her signs and symptoms and longstanding anxiety at length.  She also reports a strong family history of mood disorder.  She is amenable to both therapy and SSRI treatment. Reviewed recommendations, follow up in 2 weeks for medication check and BP check.   Dizziness and giddiness, patient reports a history of anemia.  -     CBC  Essential hypertension -     Basic Metabolic Panel -     Lipid Panel - Restart HCTZ.   Polydipsia -     HgB A1c  Terisa Starr, MD  Family Medicine Teaching Service

## 2018-05-19 NOTE — Patient Instructions (Signed)
   Prozac Take it in the morning with food You may have a bit of nausea with it    I sent a entire year of blood pressure medication to your pharmacy   I will call you with your blood work   [facility]: [phone] [facility]:  [phone]   [facility] [address] [place], [place] [address] Monday - Friday: 8:30am-12:00pm / 1:00pm-2:30pm  Therapists  [facility]  [address] [place], [place]  [phone]  [facility]  [address] [place] [address] [place], [place]  [phone]  [facility]  [address]  [place], [place] [address] [place], [place] [address]  [phone]  [facility]  [address]  [place], [place] [address] [place], [place] [address]  [phone]  It was wonderful to see you today.  Thank you for choosing [facility].   Please call [phone] with any questions about today's appointment.  Please be sure to schedule follow up at the front  desk before you leave today.   Terisa Starr, MD  Family Medicine

## 2018-05-30 ENCOUNTER — Ambulatory Visit (INDEPENDENT_AMBULATORY_CARE_PROVIDER_SITE_OTHER): Payer: Medicaid Other | Admitting: Family Medicine

## 2018-05-30 ENCOUNTER — Encounter: Payer: Self-pay | Admitting: Family Medicine

## 2018-05-30 VITALS — BP 138/82 | HR 72 | Wt 292.0 lb

## 2018-05-30 DIAGNOSIS — B373 Candidiasis of vulva and vagina: Secondary | ICD-10-CM | POA: Diagnosis not present

## 2018-05-30 DIAGNOSIS — I10 Essential (primary) hypertension: Secondary | ICD-10-CM

## 2018-05-30 DIAGNOSIS — B3731 Acute candidiasis of vulva and vagina: Secondary | ICD-10-CM

## 2018-05-30 DIAGNOSIS — R1011 Right upper quadrant pain: Secondary | ICD-10-CM

## 2018-05-30 MED ORDER — FLUCONAZOLE 150 MG PO TABS: 150.0000 mg | ORAL_TABLET | Freq: Once | ORAL | 0 refills | Status: AC

## 2018-05-30 NOTE — Patient Instructions (Signed)
Replens cleanser over the counter--for vaginal irritation  Ky lubricant- water based only  It was wonderful to see you today.  Thank you for choosing [facility].   Please call [phone] with any questions about today's appointment.  Please be sure to schedule follow up at the front  desk before you leave today.   Terisa Starr, MD  Family Medicine

## 2018-05-30 NOTE — Progress Notes (Signed)
Patient Name: Brylynn Boehringer Date of Birth: [birth year] Date of Visit: 05/30/18 PCP: Sandre Kitty, MD  Chief Complaint: Mood and abdominal pain  Subjective: Rieta Lehn is a pleasant 33 y.o. woman with history significant for hypertension, migraines, generalized anxiety disorder and obesity presenting today for follow-up for a number of things as well as new onset abdominal pain.  The patient reports intermittent right upper quadrant and left upper quadrant pain over the past 2 weeks.  2 weeks ago she had the onset of nausea and vomiting in the early morning.  This resolved.  She reports she feels overall well throughout the day but at times when she goes to the bathroom she is cramping in her right upper and left upper quadrants.  The pain is excruciating but only lasts a few seconds.  It is unassociated with food.  It can be associated with bowel movements but does not always.  She denies dysuria, hematuria, nocturnal pain, diarrhea, constipation, persistent nausea and vomiting.  She reports strong family history of gallstones and thinks this may be the case.  She is also worried about the excess amount of gas in her abdomen.  She has a history of a tubal ligation.  In terms of the patient's anxiety, she reports her panic attacks have entirely resolved.  She is taking the Prozac daily.  She reports some ongoing anxiety throughout the day.  She reports this is manageable.  She is sleeping well.  She denies side effects from the medication.  She would like to continue the medication  In terms of the patient's blood pressure she recently restarted hydrochlorothiazide.  She has discontinued diuretic she is feeling overall well.  She denies chest pain, headaches, difficulty breathing.  The patient reports a 3-day history of vaginal irritation and some discharge.  She believes she may have a yeast infection.  She is on her menses today and is certain she does not want a pelvic exam.  She is sexually  active with one partner.  As indicated above she has a history of a tubal ligation.   ROS: Per HPI ROS  I have reviewed the patient's medical, surgical, family, and social history as appropriate.   Vitals:   05/30/18 1150  BP: 138/82  Pulse: 72   Filed Weights   05/30/18 1150  Weight: 292 lb (132.5 kg)   HEENT: Sclera anicteric. Dentition is moderate. Appears well hydrated. Neck: Supple Cardiac: Regular rate and rhythm. Normal S1/S2. No murmurs, rubs, or gallops appreciated. Lungs: Clear bilaterally to ascultation.  Abdomen: Normoactive bowel sounds. No tenderness to deep or light palpation. No rebound or guarding.  Obese unable to elicit pain on my exam today Extremities: Warm, well perfused without edema.  Skin: Warm, dry Psych: Pleasant and appropriate    Avneet was seen today for hypertension.  Diagnoses and all orders for this visit:  Essential hypertension -     Basic Metabolic Panel  RUQ pain most consistent with component of splenic flexure spasm in the left upper quadrant unclear etiology to her intermittent abdominal pain.  Differential includes symptomatic biliary colic due to a gallstones, less likely nephrolithiasis were infectious etiology. -     Hepatic Function Panel -     CBC - Declined pregnancy test    Vulvovaginal candidiasis recommended and discussed pelvic exam is indicated in order to treat this condition.  Engaged in shared decision making with the patient and offered self swab.  Treatment as below. -     fluconazole (  DIFLUCAN) 150 MG tablet; Take 1 tablet (150 mg total) by mouth once for 1 dose.  Obesity, morbid (HCC) -     Lipid Panel  Terisa Starr, MD  Family Medicine Teaching Service

## 2018-05-31 LAB — HEPATIC FUNCTION PANEL
ALT: 19 IU/L (ref 0–32)
AST: 15 IU/L (ref 0–40)
Albumin: 4.2 g/dL (ref 3.9–5.0)
Alkaline Phosphatase: 104 IU/L (ref 39–117)
Bilirubin Total: <0.2 mg/dL (ref 0.0–1.2)
Bilirubin, Direct: 0.06 mg/dL (ref 0.00–0.40)
Total Protein: 6.7 g/dL (ref 6.0–8.5)

## 2018-05-31 LAB — CBC
Hematocrit: 40 % (ref 34.0–46.6)
Hemoglobin: 13 g/dL (ref 11.1–15.9)
MCH: 28.4 pg (ref 26.6–33.0)
MCHC: 32.5 g/dL (ref 31.5–35.7)
MCV: 88 fL (ref 79–97)
Platelets: 393 x10E3/uL (ref 150–450)
RBC: 4.57 x10E6/uL (ref 3.77–5.28)
RDW: 11.8 % (ref 11.7–15.4)
WBC: 11.6 x10E3/uL — ABNORMAL HIGH (ref 3.4–10.8)

## 2018-05-31 LAB — LIPID PANEL
CHOLESTEROL TOTAL: 190 mg/dL (ref 100–199)
Chol/HDL Ratio: 5.1 ratio — ABNORMAL HIGH (ref 0.0–4.4)
HDL: 37 mg/dL — AB (ref >39)
LDL CALC: 92 mg/dL (ref 0–99)
Triglycerides: 304 mg/dL — ABNORMAL HIGH (ref 0–149)
VLDL CHOLESTEROL CAL: 61 mg/dL — AB (ref 5–40)

## 2018-05-31 LAB — BASIC METABOLIC PANEL
BUN / CREAT RATIO: 16 (ref 9–23)
BUN: 12 mg/dL (ref 6–20)
CHLORIDE: 97 mmol/L (ref 96–106)
CO2: 27 mmol/L (ref 20–29)
Calcium: 9.8 mg/dL (ref 8.7–10.2)
Creatinine, Ser: 0.76 mg/dL (ref 0.57–1.00)
GFR calc Af Amer: 122 mL/min/{1.73_m2} (ref >59)
GFR calc non Af Amer: 106 mL/min/{1.73_m2} (ref >59)
GLUCOSE: 112 mg/dL — AB (ref 65–99)
POTASSIUM: 4.5 mmol/L (ref 3.5–5.2)
SODIUM: 139 mmol/L (ref 134–144)

## 2018-06-02 ENCOUNTER — Encounter: Payer: Self-pay | Admitting: Family Medicine

## 2018-06-02 NOTE — Progress Notes (Signed)
Overall labs are normal---recommend dietary changes and weight loss. Hepatic panel WNL.  Terisa Starr, MD  Family Medicine Teaching Service

## 2018-06-27 ENCOUNTER — Ambulatory Visit: Payer: Medicaid Other | Admitting: Family Medicine

## 2019-06-01 ENCOUNTER — Ambulatory Visit (HOSPITAL_COMMUNITY)
Admission: RE | Admit: 2019-06-01 | Discharge: 2019-06-01 | Disposition: A | Discharge status: Home / Self Care | Payer: Medicaid Other | Source: Ambulatory Visit | Attending: Family Medicine | Admitting: Family Medicine

## 2019-06-01 ENCOUNTER — Other Ambulatory Visit: Payer: Self-pay

## 2019-06-01 ENCOUNTER — Encounter: Payer: Self-pay | Admitting: Family Medicine

## 2019-06-01 ENCOUNTER — Ambulatory Visit (INDEPENDENT_AMBULATORY_CARE_PROVIDER_SITE_OTHER): Payer: Medicaid Other | Admitting: Family Medicine

## 2019-06-01 VITALS — BP 124/78 | HR 106 | Wt 301.0 lb

## 2019-06-01 DIAGNOSIS — R0789 Other chest pain: Secondary | ICD-10-CM

## 2019-06-01 DIAGNOSIS — M7989 Other specified soft tissue disorders: Secondary | ICD-10-CM | POA: Diagnosis not present

## 2019-06-01 DIAGNOSIS — I10 Essential (primary) hypertension: Secondary | ICD-10-CM | POA: Insufficient documentation

## 2019-06-01 DIAGNOSIS — R8290 Unspecified abnormal findings in urine: Secondary | ICD-10-CM

## 2019-06-01 DIAGNOSIS — E119 Type 2 diabetes mellitus without complications: Secondary | ICD-10-CM

## 2019-06-01 DIAGNOSIS — F411 Generalized anxiety disorder: Secondary | ICD-10-CM | POA: Diagnosis not present

## 2019-06-01 LAB — POCT URINALYSIS DIP (MANUAL ENTRY)
Bilirubin, UA: NEGATIVE
Blood, UA: NEGATIVE
Glucose, UA: NEGATIVE mg/dL
Ketones, POC UA: NEGATIVE mg/dL
Leukocytes, UA: NEGATIVE
Nitrite, UA: NEGATIVE
Protein Ur, POC: NEGATIVE mg/dL
Spec Grav, UA: 1.015 (ref 1.010–1.025)
Urobilinogen, UA: 0.2 E.U./dL
pH, UA: 7 (ref 5.0–8.0)

## 2019-06-01 LAB — POCT GLYCOSYLATED HEMOGLOBIN (HGB A1C): Hemoglobin A1C: 6.6 % — AB (ref 4.0–5.6)

## 2019-06-01 MED ORDER — CITALOPRAM HYDROBROMIDE 10 MG PO TABS: 10.0000 mg | ORAL_TABLET | Freq: Every day | ORAL | 3 refills | Status: DC

## 2019-06-01 NOTE — Patient Instructions (Addendum)
It was nice to meet you today,  Below is a list of things we did today. -We started a medicine called citalopram.  This will help with your anxiety.  I would like you to take this once a day before you go to sleep. -We had a BMP and A1c to check for diabetes and kidney function.  I will discuss the results with you on your next visit. -We get an EKG to check for your chest pain.  There is nothing concerning on your EKG.  The pain is likely related to your anxiety.  Please not take any aspirin for your chest pain.  If you want to take something you can take Tylenol. -I would try to limit your water intake. You can cut your water intake in half and still have adequate amount of water for the day. Please try to limit your soda consumption to 20 ounces a day or less.  I would like you to come back in 2 weeks so we can reevaluate all these issues again and perform your Pap smear.  Have a great day,  Frederic Jericho, MD

## 2019-06-01 NOTE — Progress Notes (Signed)
CHIEF COMPLAINT / HPI: Urinary odor, swelling of the extremities, chest pain  Swelling: Swelling in hands and feet for 3 days, but this is now resolved after using the hydrochlorothiazide.  She had not been taking it for a few months prior to this.  She now states the swelling in her hands and feet is back to normal.  She does not experience any loss of sensation or pain in the extremities.  Patient also admits to drinking 12 to 1420 ounce bottles a day of water, as well as sometimes up to 2 L of soda a day.  Also drinks at least a cup of juice and sometimes coffee as well.  She states she urinates 3 times an hour, but only twice a night.  She states she does not have a good reason for drinking as much water as she does.  It is not due to an overt thirst.  Urinary odor: Urine smells like ammonia for the past 2 weeks.  No dysuria.  Has some buring during bm after urination.  Says she can smell the iron in her urine and is about to start her menstrual cycle.  HTN: Patient has not been taking her hydrochlorothiazide since November until starting back again last week after she developed swelling.  Tolerates medication well.  Chest pain: This is more of a chronic problem at night, but has recently been occurring throughout the day and night.  Does not get worse with exertion or inspiration.  Is not positional.  Is not reproducible with palpation.  She states that will occur on the left side and right side and that when the pain resolves she develops a tingling sensation down the ipsilateral arm.  The pain lasts approximately 30 seconds.  She describes the sensation as more of a "crushing" sensation at night.  When she is experiencing the pain her hand also cramps up and finger joints "get stuck". Has nausea throughout the day and feels like she's going to pass out.  Has been taking 2 aspirin 325 every night for the past week.  Anxiety: not taking the medication bc it makes her "jittery".  Has not taken it  in at least 6 months.  Has never tried any other medications for anxiety.  No therapy.  Patient states she occasionally smokes marijuana to help with anxiety.  Does not use cigarettes.  Does not drink alcohol.  PERTINENT  PMH / PSH: Patient has history of anxiety, hypertension.  Family history includes diabetes in both her mother and father.  Occasional marijuana use, no cigarette or alcohol use.   OBJECTIVE: BP 124/78   Pulse (!) 106   Wt (!) 301 lb (136.5 kg)   LMP 05/15/2018 (Approximate)   SpO2 98%   BMI 53.32 kg/m   General: Obese female, appears stated age.  No acute distress. CV: Regular rate and rhythm, no murmurs.  2+ pulses bilaterally.  No edema in the hands or feet.  Nontender to palpation in the chest wall Pulmonary: Lungs clear to auscultation bilaterally, no wheezes, no crackles.  No tachypnea or increased work of breathing. GI: Large pannus, normal bowel sounds, mildly tender to palpation in the right lower quadrant. Psych: Normal affect, appears mildly anxious.  Makes eye contact.  Patient will endorse many symptoms if asked about them.  Appears hyper aware of her pain/discomfort.  ASSESSMENT / PLAN:  Type 2 diabetes mellitus without complications (HCC) Patient's A1c was 6.6.  Previously was 6.1.  Has a family history of diabetes  mellitus, but not previously diagnosed herself.  Patient is aware that she is obese and needs to lose weight as well as that she drinks too much soda.  Started patient on Metformin and will follow up with her in 2 weeks for her other complaints where we can discuss more about her diabetes. -Metformin 500 XL once daily.  Titrate up as needed/tolerated -Encourage diet and exercise. -Follow-up A1c in 3 months. -Patient will need diabetic retinopathy exam and microalbuminuria on next visit.  Swelling of both lower extremities Normal on exam, but patient did state that that had resolved after she started taking HCTZ.  Based on patient's history  physical exam, not concerned for heart failure, although she is at high risk for developing in the future.  Most likely due to accommodation of weight, hypertension, noncompliance with HCTZ, and excessive fluid consumption. -Advised patient to take her HCTZ -Advised patient to cut her water intake in half and reduce her soda consumption to no more than 1 20 ounce soda a day. -BMP to evaluate for electrolyte imbalance, kidney function   Hypertension Continue with current medication.  Blood pressure normal today.  Consider switching to a ACE or ARB in the future given her new diagnosis of diabetes.  Can discuss this with her on her next visit in 2 weeks.  Anxiety state Patient clearly has some level of generalized anxiety and it appears many of her symptoms especially that of chest pain are due to anxiety/panic disorder.  She was not pliant with the medication as it "made her jittery".  We will attempt a different SSRI and encourage patient to take it at night with food to reduce side effects.  Patient may have some degree of compulsive disorder with her constant/excessive water intake.  Can evaluate this further on future visits. -Citalopram 10 mg nightly -Gave patient list of therapists and encouraged her to find a low cost therapist for behavioral treatment of anxiety  Chest pain Although overall the patient's description of her symptoms did not concern me for ACS, and given her diagnosis anxiety this was the more likely reason, her description of radiation down the arm, the use of the word "crushing" to describe it at night, and endorsing diaphoresis and nausea (although these did not only occur when she was having chest pain) let me to get a EKG, which was normal.  Does not appear to be chest wall pain as it was not reproducible on palpation.  Symptoms do not sound consistent with esophageal cause either.  Therefore most likely related to her anxiety.  Restarting an antianxiety medication such as  citalopram should help with these symptoms.  Abnormal urine odor No dysuria.  Increased frequency due to excessive water intake.  Urine dipstick was normal.  Could be bacterial vaginosis given her "ammonia" description of the smell. -We will get wet prep when we do her Pap smear on her next visit.     Benay Pike, MD [facility]

## 2019-06-02 ENCOUNTER — Other Ambulatory Visit: Payer: Self-pay | Admitting: Family Medicine

## 2019-06-02 DIAGNOSIS — R079 Chest pain, unspecified: Secondary | ICD-10-CM | POA: Insufficient documentation

## 2019-06-02 DIAGNOSIS — E119 Type 2 diabetes mellitus without complications: Secondary | ICD-10-CM | POA: Insufficient documentation

## 2019-06-02 DIAGNOSIS — M7989 Other specified soft tissue disorders: Secondary | ICD-10-CM | POA: Insufficient documentation

## 2019-06-02 DIAGNOSIS — R8290 Unspecified abnormal findings in urine: Secondary | ICD-10-CM | POA: Insufficient documentation

## 2019-06-02 LAB — BASIC METABOLIC PANEL
BUN/Creatinine Ratio: 15 (ref 9–23)
BUN: 8 mg/dL (ref 6–20)
CO2: 21 mmol/L (ref 20–29)
Calcium: 9.7 mg/dL (ref 8.7–10.2)
Chloride: 100 mmol/L (ref 96–106)
Creatinine, Ser: 0.52 mg/dL — ABNORMAL LOW (ref 0.57–1.00)
GFR calc Af Amer: 147 mL/min/{1.73_m2} (ref >59)
GFR calc non Af Amer: 128 mL/min/{1.73_m2} (ref >59)
Glucose: 109 mg/dL — ABNORMAL HIGH (ref 65–99)
Potassium: 4.4 mmol/L (ref 3.5–5.2)
Sodium: 138 mmol/L (ref 134–144)

## 2019-06-02 MED ORDER — METFORMIN HCL ER 500 MG PO TB24: 500.0000 mg | ORAL_TABLET | Freq: Every day | ORAL | 3 refills | Status: AC

## 2019-06-02 MED ORDER — CITALOPRAM HYDROBROMIDE 10 MG PO TABS: 10.0000 mg | ORAL_TABLET | Freq: Every day | ORAL | 3 refills | Status: AC

## 2019-06-02 NOTE — Assessment & Plan Note (Signed)
Normal on exam, but patient did state that that had resolved after she started taking HCTZ.  Based on patient's history physical exam, not concerned for heart failure, although she is at high risk for developing in the future.  Most likely due to accommodation of weight, hypertension, noncompliance with HCTZ, and excessive fluid consumption. -Advised patient to take her HCTZ -Advised patient to cut her water intake in half and reduce her soda consumption to no more than 1 20 ounce soda a day. -BMP to evaluate for electrolyte imbalance, kidney function

## 2019-06-02 NOTE — Assessment & Plan Note (Signed)
Although overall the patient's description of her symptoms did not concern me for ACS, and given her diagnosis anxiety this was the more likely reason, her description of radiation down the arm, the use of the word "crushing" to describe it at night, and endorsing diaphoresis and nausea (although these did not only occur when she was having chest pain) let me to get a EKG, which was normal.  Does not appear to be chest wall pain as it was not reproducible on palpation.  Symptoms do not sound consistent with esophageal cause either.  Therefore most likely related to her anxiety.  Restarting an antianxiety medication such as citalopram should help with these symptoms.

## 2019-06-02 NOTE — Assessment & Plan Note (Signed)
Patient's A1c was 6.6.  Previously was 6.1.  Has a family history of diabetes mellitus, but not previously diagnosed herself.  Patient is aware that she is obese and needs to lose weight as well as that she drinks too much soda.  Started patient on Metformin and will follow up with her in 2 weeks for her other complaints where we can discuss more about her diabetes. -Metformin 500 XL once daily.  Titrate up as needed/tolerated -Encourage diet and exercise. -Follow-up A1c in 3 months. -Patient will need diabetic retinopathy exam and microalbuminuria on next visit.

## 2019-06-02 NOTE — Assessment & Plan Note (Addendum)
Continue with current medication.  Blood pressure normal today.  Consider switching to a ACE or ARB in the future given her new diagnosis of diabetes.  Can discuss this with her on her next visit in 2 weeks.

## 2019-06-02 NOTE — Assessment & Plan Note (Signed)
Patient clearly has some level of generalized anxiety and it appears many of her symptoms especially that of chest pain are due to anxiety/panic disorder.  She was not pliant with the medication as it "made her jittery".  We will attempt a different SSRI and encourage patient to take it at night with food to reduce side effects.  Patient may have some degree of compulsive disorder with her constant/excessive water intake.  Can evaluate this further on future visits. -Citalopram 10 mg nightly -Gave patient list of therapists and encouraged her to find a low cost therapist for behavioral treatment of anxiety

## 2019-06-02 NOTE — Assessment & Plan Note (Signed)
No dysuria.  Increased frequency due to excessive water intake.  Urine dipstick was normal.  Could be bacterial vaginosis given her "ammonia" description of the smell. -We will get wet prep when we do her Pap smear on her next visit.

## 2019-08-12 ENCOUNTER — Other Ambulatory Visit: Payer: Self-pay | Admitting: *Deleted

## 2019-08-12 DIAGNOSIS — I10 Essential (primary) hypertension: Secondary | ICD-10-CM

## 2019-08-12 MED ORDER — HYDROCHLOROTHIAZIDE 12.5 MG PO TABS: 12.5000 mg | ORAL_TABLET | Freq: Every day | ORAL | 3 refills | Status: DC

## 2019-11-06 ENCOUNTER — Other Ambulatory Visit: Payer: Self-pay

## 2019-11-06 ENCOUNTER — Ambulatory Visit (INDEPENDENT_AMBULATORY_CARE_PROVIDER_SITE_OTHER): Payer: Medicaid Other | Admitting: Family Medicine

## 2019-11-06 ENCOUNTER — Ambulatory Visit: Payer: Medicaid Other | Admitting: Family Medicine

## 2019-11-06 VITALS — BP 132/84 | HR 79 | Ht 62.0 in | Wt 288.4 lb

## 2019-11-06 DIAGNOSIS — E119 Type 2 diabetes mellitus without complications: Secondary | ICD-10-CM

## 2019-11-06 DIAGNOSIS — N63 Unspecified lump in unspecified breast: Secondary | ICD-10-CM

## 2019-11-06 DIAGNOSIS — G5603 Carpal tunnel syndrome, bilateral upper limbs: Secondary | ICD-10-CM | POA: Diagnosis not present

## 2019-11-06 NOTE — Assessment & Plan Note (Signed)
-  Continue with splints at night -Motrin as needed help reduce inflammation -Follow-up TSH, BMP (some cramping noted, check electrolytes) -Follow-up in 4 weeks (6 weeks from when she started wearing her response at night)

## 2019-11-06 NOTE — Assessment & Plan Note (Addendum)
A1c improved today to 7.1.  She was informed that we did not have time today to fully discuss her diabetes management.  She was informed that she has multiple things to discuss regarding her current diabetes management and that she should follow-up with me or her PCP in the next month or so for an in-depth discussion regarding her current diabetes treatment. -Restart Metformin 500 twice daily -Follow-up in 1 month for more in-depth diabetes discussion

## 2019-11-06 NOTE — Progress Notes (Signed)
SUBJECTIVE:   CHIEF COMPLAINT / HPI:   Carpal tunnel syndrome Ms. Bailey Kelly reports that she has been having some numbness and shooting pains in her hands and arms bilaterally for about the past month.  She reports numbness on most of her fingers bilaterally and shooting pains that start in her wrist and move up to her upper arm.  She is previously been diagnosed with carpal tunnel syndrome and has response at home.  She has been using wrist splints at night for the past 2 weeks.  For the past week, she is also been using gloves that wrapped tightly around her wrist which she has been applying for symptom relief.  Breast nodules She first noticed breast nodules about 2 months ago.  The largest lump seems to be in her left lower breast although she has noted other lumps on either breast.  She previously noted a breast lump in her upper chest which seems to not be present anymore.  These breast lumps may be more prominent around her menses.  She denies any known family history of breast cancer although she has a friend who recently passed away at 82 years old from breast cancer.  Diabetes Late in the visit, she reported that her providers usually talk to her about diabetes.  She is not currently taking the Metformin she is prescribed.  PERTINENT  PMH / PSH: Anxiety, diabetes, previous tubal ligation  OBJECTIVE:   BP 132/84    Pulse 79    Ht 5\' 2"  (1.575 m)    Wt 288 lb 6.4 oz (130.8 kg)    LMP 10/13/2019    SpO2 95%    BMI 52.75 kg/m    General: Alert and cooperative and appears to be in no acute distress Breast: Breasts appear symmetrical bilaterally.  She is a large breasted woman which she does make the exam a little bit more challenging.  1.0-1.5 cm breast lump noted in the left breast at the 5 o'clock position.  This lump was smooth and mobile, nontender.  A smaller 0.5 cm mass was noted on the right breast at roughly the 7 o'clock position similarly smooth, round and mobile.  No  lymphadenopathy appreciated in the axilla bilaterally. Wrists: Inspection: No gross abnormalities.  Symmetric bilaterally. Palpation: No significant tenderness. ROM: Normal flexion and extension of the fingers noted Vascularly intact Numbness noted along first, second, third and the medial aspect of the fourth finger.  This numbness was symmetric in bilateral. Special tests: Numbness/mild shooting pain reproduced with Tinel's test.  Phalen's test reproduce numbness.   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  Low suspicion for malignancy.  She was reassured that her physical exam was largely normal.  Based on history of anxiety and recent death of her friend due to breast cancer, will move forward with mammography. -Follow-up mammogram  Type 2 diabetes mellitus without complications (HCC) A1c improved today to 7.1.  She was informed that we did not have time today to fully discuss her diabetes management.  She was informed that she has multiple things to discuss regarding her current diabetes management and that she should follow-up with me or her PCP in the next month or so for an in-depth discussion regarding her current diabetes  treatment. -Restart Metformin 500 twice daily -Follow-up in 1 month for more in-depth diabetes discussion     Bailey Mo, MD [facility]

## 2019-11-06 NOTE — Assessment & Plan Note (Signed)
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  Low suspicion for malignancy.  She was reassured that her physical exam was largely normal.  Based on history of anxiety and recent death of her friend due to breast cancer, will move forward with mammography. -Follow-up mammogram

## 2019-11-06 NOTE — Patient Instructions (Signed)
Carpal tunnel syndrome: The tingling in your hands is consistent with carpal tunnel syndrome.  It looks like you have already started on the appropriate treatment.  Please continue wearing this wrist splints at night.  You can also try taking Motrin during the day to help reduce some inflammation in your wrist which could help with your symptoms.  Lets follow-up in 4 weeks to see how you are doing.  Sometimes we do need to do more for carpal tunnel syndrome.  Breast lumps: I think it is reasonable to follow-up with a mammogram at this time.  Please call the breast center to set up an appointment.  I have put in the appropriate orders.  Diabetes: Please start taking your Metformin again and follow-up in clinic in the next month or so for a longer conversation about diabetes.  I think it is important to keep this issue well controlled.  I will let you know if there are any abnormalities in your labs for today.

## 2019-11-07 LAB — BASIC METABOLIC PANEL
BUN/Creatinine Ratio: 20 (ref 9–23)
BUN: 9 mg/dL (ref 6–20)
CO2: 24 mmol/L (ref 20–29)
Calcium: 9.8 mg/dL (ref 8.7–10.2)
Chloride: 102 mmol/L (ref 96–106)
Creatinine, Ser: 0.45 mg/dL — ABNORMAL LOW (ref 0.57–1.00)
GFR calc Af Amer: 154 mL/min/{1.73_m2} (ref >59)
GFR calc non Af Amer: 134 mL/min/{1.73_m2} (ref >59)
Glucose: 110 mg/dL — ABNORMAL HIGH (ref 65–99)
Potassium: 4.6 mmol/L (ref 3.5–5.2)
Sodium: 139 mmol/L (ref 134–144)

## 2019-11-07 LAB — TSH: TSH: 2.46 u[IU]/mL (ref 0.450–4.500)

## 2019-11-09 ENCOUNTER — Encounter: Payer: Self-pay | Admitting: Family Medicine

## 2019-11-10 ENCOUNTER — Other Ambulatory Visit: Payer: Self-pay | Admitting: Family Medicine

## 2019-11-10 DIAGNOSIS — N63 Unspecified lump in unspecified breast: Secondary | ICD-10-CM

## 2019-11-25 ENCOUNTER — Ambulatory Visit
Admission: RE | Admit: 2019-11-25 | Discharge: 2019-11-25 | Disposition: A | Discharge status: Home / Self Care | Payer: Medicaid Other | Source: Ambulatory Visit | Attending: Family Medicine | Admitting: Family Medicine

## 2019-11-25 ENCOUNTER — Other Ambulatory Visit: Payer: Self-pay

## 2019-11-25 DIAGNOSIS — R928 Other abnormal and inconclusive findings on diagnostic imaging of breast: Secondary | ICD-10-CM | POA: Diagnosis not present

## 2019-11-25 DIAGNOSIS — N6489 Other specified disorders of breast: Secondary | ICD-10-CM | POA: Diagnosis not present

## 2019-11-25 DIAGNOSIS — N63 Unspecified lump in unspecified breast: Secondary | ICD-10-CM

## 2020-04-25 DIAGNOSIS — U071 COVID-19: Secondary | ICD-10-CM | POA: Diagnosis not present

## 2020-05-07 DIAGNOSIS — Z20822 Contact with and (suspected) exposure to covid-19: Secondary | ICD-10-CM | POA: Diagnosis not present

## 2020-07-11 ENCOUNTER — Other Ambulatory Visit: Payer: Self-pay | Admitting: *Deleted

## 2020-07-11 DIAGNOSIS — I10 Essential (primary) hypertension: Secondary | ICD-10-CM

## 2020-07-11 MED ORDER — HYDROCHLOROTHIAZIDE 12.5 MG PO TABS: 12.5000 mg | ORAL_TABLET | Freq: Every day | ORAL | 3 refills | Status: AC

## 2020-08-20 IMAGING — US US BREAST*R* LIMITED INC AXILLA
1 series · 2 of 2 positions shown · non-contrast
Comparison: None.
COMPARISON: None.

Addendum:
CLINICAL DATA: 33-year-old female complaining of palpable
abnormalities in both breast.

EXAM:
DIGITAL DIAGNOSTIC LEFT MAMMOGRAM WITH CAD AND TOMO
ULTRASOUND LEFT BREAST

[Series 1: us breast*right* limited inc axilla · 0.07mm/px · 2 of 2 slices shown]
[im 1/2]
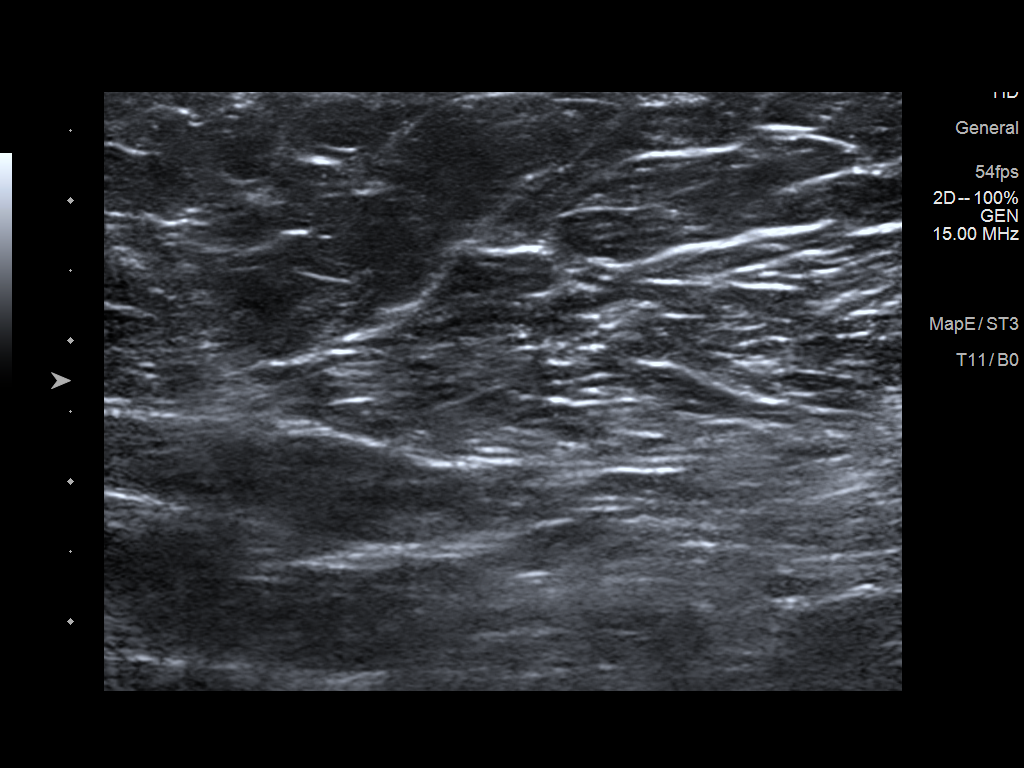
[im 2/2]
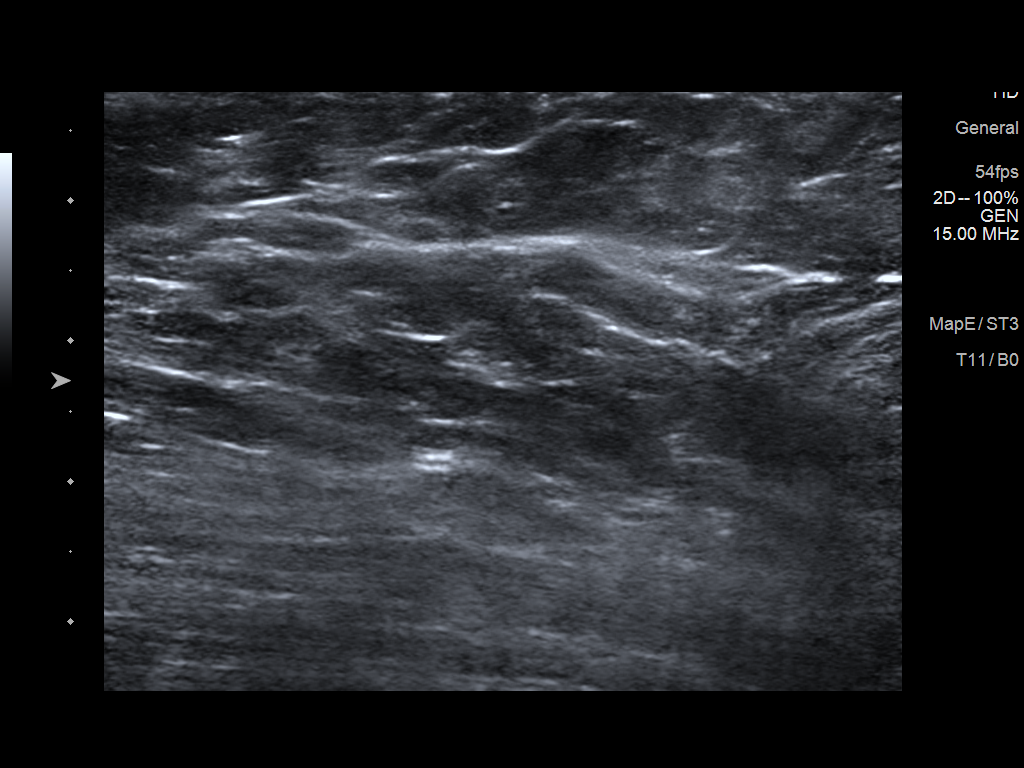

[2 of 2 positions shown; findings below may reference images not displayed]

ACR Breast Density Category b: There are scattered areas of
fibroglandular density.
FINDINGS: No suspicious mass, malignant type microcalcifications or distortion
detected in either breast. Spot tangential views of the areas of
clinical concern in each breast shows no underlying mass.

Mammographic images were processed with CAD.

On physical exam, I do not palpate a mass in the right breast at 9
o'clock 18 cm from the nipple or the left breast at 3 o'clock 12 cm
from the nipple.

Targeted ultrasound is performed, showing normal tissue in the areas
of clinical concern in the right breast at 9 o'clock 18 cm from the
nipple and in the left breast at 3 o'clock 12 cm from the nipple.
IMPRESSION: No evidence of malignancy in either breast.

RECOMMENDATION:
If the clinical exam remains benign/stable screening mammography can
be deferred until the age of 40.

I have discussed the findings and recommendations with the patient.
If applicable, a reminder letter will be sent to the patient
regarding the next appointment.

BI-RADS CATEGORY  1: Negative.

ADDENDUM:
This is an addendum to mammogram and ultrasound done on [DATE].
The exam was DIGITAL DIAGNOSTIC BILATERAL MAMMOGRAM WITH CAD AND
TOMO AND BILATERAL ULTRASOUND.

*** End of Addendum ***
ACR Breast Density Category b: There are scattered areas of
fibroglandular density.
FINDINGS: No suspicious mass, malignant type microcalcifications or distortion
detected in either breast. Spot tangential views of the areas of
clinical concern in each breast shows no underlying mass.

Mammographic images were processed with CAD.

On physical exam, I do not palpate a mass in the right breast at 9
o'clock 18 cm from the nipple or the left breast at 3 o'clock 12 cm
from the nipple.

Targeted ultrasound is performed, showing normal tissue in the areas
of clinical concern in the right breast at 9 o'clock 18 cm from the
nipple and in the left breast at 3 o'clock 12 cm from the nipple.
IMPRESSION: No evidence of malignancy in either breast.

RECOMMENDATION:
If the clinical exam remains benign/stable screening mammography can
be deferred until the age of 40.

I have discussed the findings and recommendations with the patient.
If applicable, a reminder letter will be sent to the patient
regarding the next appointment.

BI-RADS CATEGORY  1: Negative.

## 2022-03-27 ENCOUNTER — Ambulatory Visit: Payer: Medicaid Other

## 2022-03-27 NOTE — Progress Notes (Deleted)
  SUBJECTIVE:   CHIEF COMPLAINT / HPI:   Cough:  PERTINENT  PMH / PSH: T2DM, morbid obesity, HTN  OBJECTIVE:  There were no vitals taken for this visit. Physical Exam   ASSESSMENT/PLAN:  There are no diagnoses linked to this encounter. No follow-ups on file. Shelby Mattocks, DO 03/27/2022, 1:24 PM PGY-***, [facility] {    This will disappear when note is signed, click to select method of visit    :1}

## 2022-04-29 DIAGNOSIS — R051 Acute cough: Secondary | ICD-10-CM | POA: Diagnosis not present

## 2022-04-29 DIAGNOSIS — U071 COVID-19: Secondary | ICD-10-CM | POA: Diagnosis not present
